# Patient Record
Sex: Male | Born: 2005 | Race: Black or African American | Hispanic: No | Marital: Single | Smoking: Never smoker
Health system: Southern US, Community
[De-identification: ages and names within clinical notes are randomized; demographics above are authoritative.]

## PROBLEM LIST (undated history)

## (undated) DIAGNOSIS — J45909 Unspecified asthma, uncomplicated: Secondary | ICD-10-CM

## (undated) DIAGNOSIS — F909 Attention-deficit hyperactivity disorder, unspecified type: Secondary | ICD-10-CM

## (undated) HISTORY — PX: TYMPANOSTOMY TUBE PLACEMENT: SHX32

## (undated) HISTORY — PX: ADENOIDECTOMY: SUR15

---
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
## 2023-01-05 ENCOUNTER — Emergency Department (HOSPITAL_COMMUNITY)
Admission: EM | Admit: 2023-01-05 | Discharge: 2023-01-05 | Disposition: A | Discharge status: Home / Self Care | Payer: BC Managed Care – PPO | Attending: Emergency Medicine | Admitting: Emergency Medicine

## 2023-01-05 ENCOUNTER — Other Ambulatory Visit: Payer: Self-pay

## 2023-01-05 ENCOUNTER — Emergency Department (HOSPITAL_COMMUNITY): Payer: BC Managed Care – PPO

## 2023-01-05 DIAGNOSIS — R000 Tachycardia, unspecified: Secondary | ICD-10-CM | POA: Insufficient documentation

## 2023-01-05 DIAGNOSIS — J189 Pneumonia, unspecified organism: Secondary | ICD-10-CM | POA: Diagnosis not present

## 2023-01-05 DIAGNOSIS — R079 Chest pain, unspecified: Secondary | ICD-10-CM

## 2023-01-05 DIAGNOSIS — R0789 Other chest pain: Secondary | ICD-10-CM | POA: Diagnosis present

## 2023-01-05 DIAGNOSIS — J45909 Unspecified asthma, uncomplicated: Secondary | ICD-10-CM | POA: Insufficient documentation

## 2023-01-05 LAB — BASIC METABOLIC PANEL
Anion gap: 12 (ref 5–15)
BUN: 15 mg/dL (ref 6–20)
CO2: 24 mmol/L (ref 22–32)
Calcium: 9.1 mg/dL (ref 8.9–10.3)
Chloride: 101 mmol/L (ref 98–111)
Creatinine, Ser: 1.13 mg/dL (ref 0.61–1.24)
GFR, Estimated: >60 mL/min (ref >60)
Glucose, Bld: 102 mg/dL — ABNORMAL HIGH (ref 70–99)
Potassium: 3.5 mmol/L (ref 3.5–5.1)
Sodium: 137 mmol/L (ref 135–145)

## 2023-01-05 LAB — CBC
HCT: 41.8 % (ref 39.0–52.0)
Hemoglobin: 13.5 g/dL (ref 13.0–17.0)
MCH: 27 pg (ref 26.0–34.0)
MCHC: 32.3 g/dL (ref 30.0–36.0)
MCV: 83.6 fL (ref 80.0–100.0)
Platelets: 315 10*3/uL (ref 150–400)
RBC: 5 MIL/uL (ref 4.22–5.81)
RDW: 12.8 % (ref 11.5–15.5)
WBC: 8.2 10*3/uL (ref 4.0–10.5)
nRBC: 0 % (ref 0.0–0.2)

## 2023-01-05 LAB — TROPONIN I (HIGH SENSITIVITY): Troponin I (High Sensitivity): 7 ng/L (ref <18)

## 2023-01-05 MED ORDER — DOXYCYCLINE HYCLATE 100 MG PO CAPS: 100.0000 mg | ORAL_CAPSULE | Freq: Two times a day (BID) | ORAL | 0 refills | Status: AC

## 2023-01-05 NOTE — ED Triage Notes (Signed)
Patient reports central chest pain with SOB onset yesterday .

## 2023-01-05 NOTE — ED Notes (Signed)
PT refusing respiratory swab and second troponin. Provider made aware.

## 2023-01-05 NOTE — ED Provider Triage Note (Signed)
Emergency Medicine Provider Triage Evaluation Note  Clayton Stewart , a 17 y.o. male  was evaluated in triage.  Pt complains of shortness of breath, chest pain.  Patient states that he has been ill the past 3 to 4 days with nasal congestion, mild cough.  Reports experiencing chest pain yesterday.  States the pain is worsened with physical activity and relieved with rest.  Has noted some accompanying shortness of breath.  Denies reported fever/chills, abdominal pain, nausea, vomiting.  Reports distant history of asthma but currently uses no inhaler.  Review of Systems  Positive: See above Negative:   Physical Exam  BP 136/84 (BP Location: Right Arm)   Pulse 94   Temp 100 F (37.8 C)   Resp 18   SpO2 97%  Gen:   Awake, no distress   Resp:  Normal effort  MSK:   Moves extremities without difficulty  Other:   Medical Decision Making  Medically screening exam initiated at 9:03 PM.  Appropriate orders placed.  Clayton Stewart was informed that the remainder of the evaluation will be completed by another provider, this initial triage assessment does not replace that evaluation, and the importance of remaining in the ED until their evaluation is complete.     Peter Garter, [place] 01/05/23 2104

## 2023-01-05 NOTE — Discharge Instructions (Addendum)
Use tylenol every 4 hrs and motrin every 6 hrs for pain or fever. Return for difficulty breathing, passing out, or new concerns. Stay well hydrated.  Take antibiotics as directed.

## 2023-01-05 NOTE — ED Provider Notes (Signed)
[place] [facility] AT [facility] Provider Note   CSN: [id number] Arrival date & time: 01/05/23  2027     History  Chief Complaint  Patient presents with   Chest Pain    Clayton Stewart is a 17 y.o. male.  Patient presents with anterior chest pain, cough, congestion and intermittent fevers.  Patient says is worse with exertion as well.  No history of heart problems, no concerning family history at this young of age.  Patient has history of asthma in the past but currently does not need inhaler.  Mild productive cough.  Patient has no blood clot history, no recent surgery, no leg swelling or leg pain.       Home Medications Prior to Admission medications   Medication Sig Start Date End Date Taking? Authorizing Provider  doxycycline (VIBRAMYCIN) 100 MG capsule Take 1 capsule (100 mg total) by mouth 2 (two) times daily. One po bid x 7 days 01/05/23  Yes Blane Ohara, MD      Allergies    Shellfish allergy and Penicillins    Review of Systems   Review of Systems  Constitutional:  Positive for fever. Negative for chills.  HENT:  Positive for congestion.   Eyes:  Negative for visual disturbance.  Respiratory:  Positive for cough and shortness of breath.   Cardiovascular:  Positive for chest pain.  Gastrointestinal:  Negative for abdominal pain and vomiting.  Genitourinary:  Negative for dysuria and flank pain.  Musculoskeletal:  Negative for back pain, neck pain and neck stiffness.  Skin:  Negative for rash.  Neurological:  Negative for light-headedness and headaches.    Physical Exam Updated Vital Signs BP 127/79   Pulse 91   Temp 100 F (37.8 C)   Resp 18   SpO2 99%  Physical Exam Vitals and nursing note reviewed.  Constitutional:      General: He is not in acute distress.    Appearance: He is well-developed.  HENT:     Head: Normocephalic and atraumatic.     Mouth/Throat:     Mouth: Mucous membranes are moist.  Eyes:     General:         Right eye: No discharge.        Left eye: No discharge.     Conjunctiva/sclera: Conjunctivae normal.  Neck:     Trachea: No tracheal deviation.  Cardiovascular:     Rate and Rhythm: Normal rate and regular rhythm.     Heart sounds: No murmur heard. Pulmonary:     Effort: Pulmonary effort is normal.     Breath sounds: Normal breath sounds.  Abdominal:     General: There is no distension.     Palpations: Abdomen is soft.     Tenderness: There is no abdominal tenderness. There is no guarding.  Musculoskeletal:     Cervical back: Normal range of motion and neck supple. No rigidity.  Skin:    General: Skin is warm.     Capillary Refill: Capillary refill takes less than 2 seconds.     Findings: No rash.  Neurological:     General: No focal deficit present.     Mental Status: He is alert.     Cranial Nerves: No cranial nerve deficit.  Psychiatric:        Mood and Affect: Mood normal.     ED Results / Procedures / Treatments   Labs (all labs ordered are listed, but only abnormal results are displayed) Labs  Reviewed  BASIC METABOLIC PANEL - Abnormal; Notable for the following components:      Result Value   Glucose, Bld 102 (*)    All other components within normal limits  RESP PANEL BY RT-PCR (RSV, FLU A&B, COVID)  RVPGX2  CBC  TROPONIN I (HIGH SENSITIVITY)  TROPONIN I (HIGH SENSITIVITY)    EKG EKG Interpretation  Date/Time:  Thursday Jan 05 2023 20:41:09 EDT Ventricular Rate:  104 PR Interval:  134 QRS Duration: 98 QT Interval:  322 QTC Calculation: 423 R Axis:   82 Text Interpretation: Sinus tachycardia Otherwise normal ECG No previous ECGs available Confirmed by Blane Ohara [phone]) on 01/05/2023 9:43:52 PM  Radiology DG Chest 1 View  Result Date: 01/05/2023 CLINICAL DATA:  Chest pain EXAM: CHEST  1 VIEW COMPARISON:  02/07/2005 FINDINGS: The heart size and mediastinal contours are within normal limits. Both lungs are clear. The visualized skeletal  structures are unremarkable. IMPRESSION: No active disease. Electronically Signed   By: Jasmine Pang M.D.   On: 01/05/2023 21:33    Procedures Procedures    Medications Ordered in ED Medications - No data to display  ED Course/ Medical Decision Making/ A&P                             Medical Decision Making Amount and/or Complexity of Data Reviewed ECG/medicine tests: ordered.  Risk Prescription drug management.   Patient presents with clinical concern primarily for respiratory infection given fever cough chest discomfort.  Discussed possibly atypical pneumonia versus bacterial pneumonia versus viral process.  Other differentials include atypical cardiac/pericarditis/pleuritis.  Patient well-appearing, normal blood pressure, normal troponin no concern for myocarditis at this time.  Blood work was ordered and independently reviewed showing normal electrolytes, hemoglobin white blood cell count unremarkable.  Troponin level normal.  Patient said fairly constant chest pain for over 3 days.  Chest x-ray reviewed no acute infiltrate or cardiomegaly.  Patient very low risk blood clot with infectious symptoms do not feel D-dimer would help.  Parents with patient in the room we talked about follow-up and trial of doxycycline for atypical pneumonia.  Everyone is comfortable this plan.   Repeat EKG reviewed heart rate 89, normal QT, J-point elevation, no ST depression or T wave inversion.        Final Clinical Impression(s) / ED Diagnoses Final diagnoses:  Atypical pneumonia  Acute chest pain    Rx / DC Orders ED Discharge Orders          Ordered    doxycycline (VIBRAMYCIN) 100 MG capsule  2 times daily        01/05/23 2320              Blane Ohara, MD 01/05/23 2343

## 2024-03-08 ENCOUNTER — Encounter: Payer: Self-pay | Admitting: Advanced Practice Midwife
